# Patient Record
Sex: Female | Born: 1990 | Race: Black or African American | Hispanic: No | Marital: Single | Smoking: Never smoker
Health system: Southern US, Community
[De-identification: ages and names within clinical notes are randomized; demographics above are authoritative.]

---

## 2011-06-12 ENCOUNTER — Emergency Department (HOSPITAL_BASED_OUTPATIENT_CLINIC_OR_DEPARTMENT_OTHER)
Admission: EM | Admit: 2011-06-12 | Discharge: 2011-06-12 | Disposition: A | Discharge status: Home / Self Care | Payer: Self-pay | Attending: Emergency Medicine | Admitting: Emergency Medicine

## 2011-06-12 ENCOUNTER — Encounter: Payer: Self-pay | Admitting: Emergency Medicine

## 2011-06-12 DIAGNOSIS — N390 Urinary tract infection, site not specified: Secondary | ICD-10-CM | POA: Insufficient documentation

## 2011-06-12 DIAGNOSIS — B9689 Other specified bacterial agents as the cause of diseases classified elsewhere: Secondary | ICD-10-CM | POA: Insufficient documentation

## 2011-06-12 DIAGNOSIS — N760 Acute vaginitis: Secondary | ICD-10-CM | POA: Insufficient documentation

## 2011-06-12 DIAGNOSIS — A499 Bacterial infection, unspecified: Secondary | ICD-10-CM | POA: Insufficient documentation

## 2011-06-12 LAB — URINALYSIS, ROUTINE W REFLEX MICROSCOPIC
Bilirubin Urine: NEGATIVE
Nitrite: POSITIVE — AB
Specific Gravity, Urine: 1.02 (ref 1.005–1.030)
pH: 7 (ref 5.0–8.0)

## 2011-06-12 LAB — PREGNANCY, URINE: Preg Test, Ur: NEGATIVE

## 2011-06-12 LAB — WET PREP, GENITAL

## 2011-06-12 LAB — URINE MICROSCOPIC-ADD ON

## 2011-06-12 MED ORDER — METRONIDAZOLE 500 MG PO TABS: 500.0000 mg | ORAL_TABLET | Freq: Two times a day (BID) | ORAL | Status: AC

## 2011-06-12 MED ORDER — CIPROFLOXACIN HCL 500 MG PO TABS: 500.0000 mg | ORAL_TABLET | Freq: Two times a day (BID) | ORAL | Status: AC

## 2011-06-12 NOTE — ED Notes (Signed)
The patient is undressed from the waist down. The bed is lock and is in the lowest position, the call light is within reach. The patient is resting and warm blanket has been given.

## 2011-06-12 NOTE — ED Notes (Signed)
Vaginal itching and discharge x 2 weeks.  No dysuria.  No N/V/D.  No abdominal pain.

## 2011-06-12 NOTE — ED Provider Notes (Signed)
History     CSN: [id number] Arrival date & time: 06/12/2011  6:45 PM  Chief Complaint  Patient presents with  . Vaginal Itching  . Vaginal Discharge   HPI Comments: Pt states that she is having a white vaginal discharge  Patient is a 20 y.o. female presenting with vaginal discharge. The history is provided by the patient. No language interpreter was used.  Vaginal Discharge This is a new problem. The current episode started 1 to 4 weeks ago. The problem occurs constantly. The problem has been unchanged. Pertinent negatives include no abdominal pain, fever, numbness, sore throat or urinary symptoms. The symptoms are aggravated by nothing. She has tried nothing for the symptoms. The treatment provided no relief.    History reviewed. No pertinent past medical history.  History reviewed. No pertinent past surgical history.  History reviewed. No pertinent family history.  History  Substance Use Topics  . Smoking status: Never Smoker   . Smokeless tobacco: Not on file  . Alcohol Use: Yes     occasionally    OB History    Grav Para Term Preterm Abortions TAB SAB Ect Mult Living                  Review of Systems  Constitutional: Negative for fever.  HENT: Negative for sore throat.   Gastrointestinal: Negative for abdominal pain.  Genitourinary: Positive for vaginal discharge.  Neurological: Negative for numbness.  All other systems reviewed and are negative.    Physical Exam  BP 129/79  Pulse 73  Temp(Src) 98.4 F (36.9 C) (Oral)  Resp 18  SpO2 100%  LMP 05/29/2011  Physical Exam  Nursing note and vitals reviewed. Constitutional: She is oriented to person, place, and time. She appears well-developed and well-nourished.  HENT:  Head: Normocephalic and atraumatic.  Eyes: Pupils are equal, round, and reactive to light.  Cardiovascular: Normal rate and regular rhythm.   Pulmonary/Chest: Effort normal and breath sounds normal.  Abdominal: Soft. Bowel sounds are  normal.  Genitourinary: Cervix exhibits no motion tenderness. Right adnexum displays no tenderness. Left adnexum displays no tenderness. Vaginal discharge found.  Musculoskeletal: Normal range of motion.  Neurological: She is alert and oriented to person, place, and time.  Skin: Skin is dry.  Psychiatric: She has a normal mood and affect.    ED Course  Procedures Results for orders placed during the hospital encounter of 06/12/11  WET PREP, GENITAL      Component Value Range   Yeast, Wet Prep FEW (*) NONE SEEN    Trich, Wet Prep NONE SEEN  NONE SEEN    Clue Cells, Wet Prep TOO NUMEROUS TO COUNT (*) NONE SEEN    WBC, Wet Prep HPF POC FEW (*) NONE SEEN   URINALYSIS, ROUTINE W REFLEX MICROSCOPIC      Component Value Range   Color, Urine YELLOW  YELLOW    Appearance CLOUDY (*) CLEAR    Specific Gravity, Urine 1.020  1.005 - 1.030    pH 7.0  5.0 - 8.0    Glucose, UA NEGATIVE  NEGATIVE (mg/dL)   Hgb urine dipstick NEGATIVE  NEGATIVE    Bilirubin Urine NEGATIVE  NEGATIVE    Ketones, ur NEGATIVE  NEGATIVE (mg/dL)   Protein, ur NEGATIVE  NEGATIVE (mg/dL)   Urobilinogen, UA 0.2  0.0 - 1.0 (mg/dL)   Nitrite POSITIVE (*) NEGATIVE    Leukocytes, UA MODERATE (*) NEGATIVE   PREGNANCY, URINE      Component Value Range  Preg Test, Ur NEGATIVE    URINE MICROSCOPIC-ADD ON      Component Value Range   Squamous Epithelial / LPF FEW (*) RARE    WBC, UA 7-10  <3 (WBC/hpf)   Bacteria, UA MANY (*) RARE    No results found.  MDM Will treat for a simple uti and bv:pt is not having fever:pt is not pregnant     Teressa Lower, NP 06/12/11 1958

## 2011-06-13 NOTE — ED Provider Notes (Signed)
Medical screening examination/treatment/procedure(s) were performed by non-physician practitioner and as supervising physician I was immediately available for consultation/collaboration.   Vida Roller, MD 06/13/11 [phone]

## 2011-06-14 LAB — GC/CHLAMYDIA PROBE AMP, GENITAL: Chlamydia, DNA Probe: NEGATIVE

## 2012-11-18 ENCOUNTER — Emergency Department: Payer: Self-pay | Admitting: Emergency Medicine

## 2012-11-18 LAB — URINALYSIS, COMPLETE
Bilirubin,UR: NEGATIVE
Blood: NEGATIVE
Glucose,UR: NEGATIVE mg/dL (ref 0–75)
Leukocyte Esterase: NEGATIVE
Nitrite: POSITIVE
Ph: 5 (ref 4.5–8.0)
Protein: NEGATIVE
RBC,UR: 1 /HPF (ref 0–5)
Specific Gravity: 1.024 (ref 1.003–1.030)
Squamous Epithelial: 3
WBC UR: 2 /HPF (ref 0–5)

## 2012-11-18 LAB — CBC
HCT: 39.7 % (ref 35.0–47.0)
HGB: 13.3 g/dL (ref 12.0–16.0)
MCH: 28.9 pg (ref 26.0–34.0)
MCHC: 33.5 g/dL (ref 32.0–36.0)
Platelet: 291 10*3/uL (ref 150–440)
WBC: 12.7 10*3/uL — ABNORMAL HIGH (ref 3.6–11.0)

## 2012-11-18 LAB — COMPREHENSIVE METABOLIC PANEL
Albumin: 4.7 g/dL (ref 3.4–5.0)
Alkaline Phosphatase: 92 U/L (ref 50–136)
Anion Gap: 9 (ref 7–16)
EGFR (Non-African Amer.): >60
Glucose: 111 mg/dL — ABNORMAL HIGH (ref 65–99)
Osmolality: 274 (ref 275–301)
Potassium: 3.7 mmol/L (ref 3.5–5.1)
Sodium: 137 mmol/L (ref 136–145)

## 2012-11-18 LAB — LIPASE, BLOOD: Lipase: 126 U/L (ref 73–393)

## 2013-07-24 ENCOUNTER — Emergency Department: Payer: Self-pay | Admitting: Internal Medicine

## 2013-12-13 ENCOUNTER — Emergency Department: Payer: Self-pay | Admitting: Internal Medicine

## 2013-12-13 LAB — COMPREHENSIVE METABOLIC PANEL
ALBUMIN: 4.5 g/dL (ref 3.4–5.0)
Alkaline Phosphatase: 76 U/L
Anion Gap: 8 (ref 7–16)
BUN: 9 mg/dL (ref 7–18)
Bilirubin,Total: 1.1 mg/dL — ABNORMAL HIGH (ref 0.2–1.0)
CALCIUM: 10 mg/dL (ref 8.5–10.1)
CHLORIDE: 101 mmol/L (ref 98–107)
CREATININE: 0.73 mg/dL (ref 0.60–1.30)
Co2: 28 mmol/L (ref 21–32)
GLUCOSE: 116 mg/dL — AB (ref 65–99)
OSMOLALITY: 273 (ref 275–301)
Potassium: 3.6 mmol/L (ref 3.5–5.1)
SGOT(AST): 18 U/L (ref 15–37)
SGPT (ALT): 19 U/L (ref 12–78)
Sodium: 137 mmol/L (ref 136–145)
Total Protein: 8.9 g/dL — ABNORMAL HIGH (ref 6.4–8.2)

## 2013-12-13 LAB — CBC WITH DIFFERENTIAL/PLATELET
Basophil #: 0.1 10*3/uL (ref 0.0–0.1)
Basophil %: 0.4 %
EOS PCT: 0.3 %
Eosinophil #: 0 10*3/uL (ref 0.0–0.7)
HCT: 40.1 % (ref 35.0–47.0)
HGB: 13 g/dL (ref 12.0–16.0)
LYMPHS ABS: 1.4 10*3/uL (ref 1.0–3.6)
LYMPHS PCT: 9.8 %
MCH: 29 pg (ref 26.0–34.0)
MCHC: 32.4 g/dL (ref 32.0–36.0)
MCV: 90 fL (ref 80–100)
MONOS PCT: 6.2 %
Monocyte #: 0.9 x10 3/mm (ref 0.2–0.9)
NEUTROS ABS: 11.8 10*3/uL — AB (ref 1.4–6.5)
Neutrophil %: 83.3 %
Platelet: 321 10*3/uL (ref 150–440)
RBC: 4.48 10*6/uL (ref 3.80–5.20)
RDW: 13.6 % (ref 11.5–14.5)
WBC: 14.2 10*3/uL — ABNORMAL HIGH (ref 3.6–11.0)

## 2013-12-13 LAB — URINALYSIS, COMPLETE
Bacteria: NONE SEEN
Bilirubin,UR: NEGATIVE
GLUCOSE, UR: NEGATIVE mg/dL (ref 0–75)
Ketone: NEGATIVE
Nitrite: NEGATIVE
PH: 5 (ref 4.5–8.0)
Protein: NEGATIVE
RBC,UR: 1 /HPF (ref 0–5)
SPECIFIC GRAVITY: 1.011 (ref 1.003–1.030)

## 2013-12-13 LAB — GC/CHLAMYDIA PROBE AMP

## 2013-12-13 LAB — WET PREP, GENITAL

## 2017-11-01 ENCOUNTER — Other Ambulatory Visit: Payer: Self-pay

## 2017-11-01 ENCOUNTER — Emergency Department (HOSPITAL_COMMUNITY)
Admission: EM | Admit: 2017-11-01 | Discharge: 2017-11-01 | Disposition: A | Discharge status: Home / Self Care | Payer: Self-pay | Attending: Emergency Medicine | Admitting: Emergency Medicine

## 2017-11-01 ENCOUNTER — Emergency Department (HOSPITAL_COMMUNITY): Payer: Self-pay

## 2017-11-01 DIAGNOSIS — M26621 Arthralgia of right temporomandibular joint: Secondary | ICD-10-CM | POA: Insufficient documentation

## 2017-11-01 MED ORDER — ACETAMINOPHEN 500 MG PO TABS
500.0000 mg | ORAL_TABLET | Freq: Once | ORAL | Status: AC
  Administered 2017-11-01: 500 mg via ORAL
  Filled 2017-11-01: qty 1

## 2017-11-01 MED ORDER — METHOCARBAMOL 500 MG PO TABS
750.0000 mg | ORAL_TABLET | Freq: Once | ORAL | Status: AC
Start: 2017-11-01 — End: 2017-11-01
  Administered 2017-11-01: 750 mg via ORAL
  Filled 2017-11-01: qty 2

## 2017-11-01 MED ORDER — METHOCARBAMOL 500 MG PO TABS: 500.0000 mg | ORAL_TABLET | Freq: Two times a day (BID) | ORAL | 0 refills | Status: DC

## 2017-11-01 NOTE — ED Notes (Signed)
Patient transported to CT 
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
## 2017-11-01 NOTE — Discharge Instructions (Signed)
Your seen here today for right jaw pain.  Your CT did not show evidence of fracture or dislocation.  No prescribing you a muscle relaxer that I would like you to take for your symptoms.  Please take Tylenol 650 mg every 6 hours as needed additionally for pain.  Please follow-up with ENT specialist.
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
## 2019-08-03 ENCOUNTER — Encounter (HOSPITAL_COMMUNITY): Payer: Self-pay | Admitting: Emergency Medicine

## 2019-08-03 ENCOUNTER — Emergency Department (HOSPITAL_COMMUNITY)
Admission: EM | Admit: 2019-08-03 | Discharge: 2019-08-03 | Disposition: A | Discharge status: Home / Self Care | Payer: Self-pay | Attending: Emergency Medicine | Admitting: Emergency Medicine

## 2019-08-03 ENCOUNTER — Other Ambulatory Visit: Payer: Self-pay

## 2019-08-03 ENCOUNTER — Emergency Department (HOSPITAL_COMMUNITY): Payer: Self-pay

## 2019-08-03 DIAGNOSIS — Y939 Activity, unspecified: Secondary | ICD-10-CM | POA: Insufficient documentation

## 2019-08-03 DIAGNOSIS — Y929 Unspecified place or not applicable: Secondary | ICD-10-CM | POA: Insufficient documentation

## 2019-08-03 DIAGNOSIS — Y999 Unspecified external cause status: Secondary | ICD-10-CM | POA: Insufficient documentation

## 2019-08-03 DIAGNOSIS — R42 Dizziness and giddiness: Secondary | ICD-10-CM | POA: Insufficient documentation

## 2019-08-03 DIAGNOSIS — S0990XA Unspecified injury of head, initial encounter: Secondary | ICD-10-CM | POA: Insufficient documentation

## 2019-08-03 DIAGNOSIS — R519 Headache, unspecified: Secondary | ICD-10-CM | POA: Insufficient documentation

## 2019-08-03 DIAGNOSIS — S060X0A Concussion without loss of consciousness, initial encounter: Secondary | ICD-10-CM | POA: Insufficient documentation

## 2019-08-03 DIAGNOSIS — S060X9A Concussion with loss of consciousness of unspecified duration, initial encounter: Secondary | ICD-10-CM

## 2019-08-03 DIAGNOSIS — H53149 Visual discomfort, unspecified: Secondary | ICD-10-CM | POA: Insufficient documentation

## 2019-08-03 MED ORDER — HYDROCODONE-ACETAMINOPHEN 5-325 MG PO TABS: 1.0000 | ORAL_TABLET | Freq: Four times a day (QID) | ORAL | 0 refills | Status: AC | PRN

## 2019-08-03 MED ORDER — HYDROCODONE-ACETAMINOPHEN 5-325 MG PO TABS
1.0000 | ORAL_TABLET | Freq: Once | ORAL | Status: AC
  Administered 2019-08-03: 1 via ORAL
  Filled 2019-08-03: qty 1

## 2019-08-03 MED ORDER — NAPROXEN 500 MG PO TABS: 500.0000 mg | ORAL_TABLET | Freq: Two times a day (BID) | ORAL | 0 refills | Status: DC

## 2019-08-03 NOTE — Discharge Instructions (Signed)
Get plenty of rest.  Apply ice to the area of pain and swelling and try to sleep with your head elevated some to decrease the swelling. Thank you for allowing me to care for you today. Please return to the emergency department if you have new or worsening symptoms. Take your medications as instructed.

## 2019-08-03 NOTE — ED Notes (Signed)
Pt A&ox4, ambulatory at d/c with independent steady gait, NAD. Pt verbalized understanding of d/c instructions and follow up care. 
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
## 2019-08-03 NOTE — ED Notes (Signed)
The PA at bedside providing update.
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
## 2019-10-12 ENCOUNTER — Other Ambulatory Visit: Payer: Self-pay

## 2019-10-12 DIAGNOSIS — Z20822 Contact with and (suspected) exposure to covid-19: Secondary | ICD-10-CM

## 2019-10-13 LAB — NOVEL CORONAVIRUS, NAA: SARS-CoV-2, NAA: DETECTED — AB

## 2019-10-14 ENCOUNTER — Telehealth (HOSPITAL_COMMUNITY): Payer: Self-pay | Admitting: Critical Care Medicine

## 2019-10-14 NOTE — Telephone Encounter (Signed)
Patient was positive Covid infection on December 18 and she is asymptomatic.  She is not a candidate for monoclonal antibody.  She understands to stay in isolation till December 29

## 2019-10-14 NOTE — Telephone Encounter (Signed)
-----   Message from Carlisle Beers, RN sent at 10/14/2019  6:59 AM EST -----  ----- Message ----- From: Lavone Neri Lab Results In Sent: 10/13/2019  10:36 PM EST To: Mobile Screening Testing Result Pool

## 2020-05-06 ENCOUNTER — Telehealth: Payer: Self-pay | Admitting: Physician Assistant

## 2020-05-06 DIAGNOSIS — R3989 Other symptoms and signs involving the genitourinary system: Secondary | ICD-10-CM

## 2020-05-06 MED ORDER — ACYCLOVIR 400 MG PO TABS: 400.0000 mg | ORAL_TABLET | Freq: Three times a day (TID) | ORAL | 0 refills | Status: AC

## 2020-05-06 NOTE — Progress Notes (Signed)
E-Visit for Herpes Simplex  We are sorry that you are not feeling well.  Here is how we plan to help!  Based on what you have shared ith me, it looks like you may be having an outbreak/flare-up of genital herpes.    I have prescribed I have prescribed Acyclovir 400 mg Take on by mouth three times daily for 5 days.    You can take ibuprofen 600mg  every 6 hours as needed for headache or pain. You can also alternate between ibuprofen and tylenol.   PLEASE SEEK EVALUATION IF THE HEADACHE PERSISTS, WORSENS, OR YOU DEVELOP A FEVER, NECK STIFFNESS, OR OTHER CONCERNING SYMPTOMS.   If you have been prescribed long term medications to be taken on a regular basis, it is important to follow the recommendations and take them as ordered.    Outbreaks usually include blisters and open sores in the genital area. Outbreaks that happen after the first time are usually not as severe and do not last as long. Genital Herpes Simplex is a commonly sexually transmitted viral infection that is found worldwide. Most of these genital infections are caused by one or two herpes simplex viruses that is passed from person to person during vaginal, oral, or anal sex. Sometimes, people do not know they have herpes because they do not have any symptoms.  Please be aware that if you have genital herpes you can be contagious even when you are not having rash or flare-up and you may not have any symptoms, even when you are taking suppressive medicines.  Herpes cannot be cured. The disease usually causes most problems during the first few years. After that, the virus is still there, but it causes few to no symptoms. Even when the virus is active, people with herpes can take medicines to reduce and help prevent symptoms.  Herpes is an infection that can cause blisters and open sores on the genital area. Herpes is caused by a virus that is passed from person to person during vaginal, oral, or anal sex. Sometimes, people do not know they  have herpes because they do not have any symptoms. Herpes cannot be cured. The disease usually causes most problems during the first few years. After that, the virus is still there, but it causes few to no symptoms. Even when the virus is active, people with herpes can take medicines to reduce and help prevent symptoms.  If you have been prescribed medications to be taken on a regular basis, it is important to follow the recommendations and take them as ordered.  Some people with herpes never have any symptoms. But other people can develop symptoms within a few weeks of being infected with the herpes virus   Symptoms usually include blisters in the genital area. In women, this area includes the vagina, buttocks, anus, or thighs. In men, this area includes the penis, scrotum, anus, butt, or thighs. The blisters can become painful open sores, which then crust over as they heal. Sometimes, people can have other symptoms that include:  ?Blisters on the mouth or lips ?Fever, headache, or pain in the joints ?Trouble urinating  Outbreaks might occur every month or more often, or just once or twice a year. Sometimes, people can tell when an outbreak will occur, because they feel itching or pain beforehand. Sometimes they do not know that an outbreak is coming because they have no symptoms. Whatever your pattern is, keep in mind that herpes outbreaks usually become less frequent over time as you get  older. Certain things, called "triggers," can make outbreaks more likely to occur. These include stress, sunlight, menstrual periods,or getting sick.  Antiviral therapy can shorten the duration of symptoms and signs in primary infection, which, when untreated, can be associated with significant increase in the symptoms of the disease.  HOME CARE . Use a portable bath (such as a "Sitz bath") where you can sit in warm water for about 20 minutes. Your bathtub could also work. Avoid bubble baths.  . Keep the  genital area clean and dry and avoid tight clothes.  . Take over-the-counter pain medicine such as acetaminophen (brand name: Tylenol) or ibuprofen sample brand names: Advil, Motrin). But avoid aspirin.  . Only take medications as instructed by your medical team.  . You are most likely to spread herpes to a sex partner when you have blisters and open sores on your body. But it's also possible to spread herpes to your partner when you do not have any symptoms. That is because herpes can be present on your body without causing any symptoms, like blisters or pain.  . Telling your sex partner that you have herpes can be hard. But it can help protect them, since there are ways to lower the risk of spreading the infection.   . Using a condom every time you have sex  . Not having sex when you have symptoms  . Not having oral sex if you have blisters or open sores (in the genital area or around your mouth)  MAKE SURE YOU   . Understand these instructions. . Do not have sex without using a condom until you have been seen by a doctor and as instructed by the provider . If you are not better or improved within 7 days, you MUST have a follow up at your doctor or the health department for evaluation. There are other causes of rashes in the genital region.  Thank you for choosing an e-visit. Your e-visit answers were reviewed by a board certified advanced clinical practitioner to complete your personal care plan. Depending upon the condition, your plan could have included both over the counter or prescription medications.  Please review your pharmacy choice. Make sure the pharmacy is open so you can pick up prescription now. If there is a problem, you may contact your provider through [employer] and have the prescription routed to another pharmacy.  Your safety is important to [place]. If you have drug allergies check your prescription carefully.   For the next 24 hours you can use MyChart to ask  questions about today's visit, request a non-urgent call back, or ask for a work or school excuse. You will get an email in the next two days asking about your experience. I hope that your e-visit has been valuable and will speed your recovery  Greater than 5 minutes, yet less than 10 minutes of time have been spent researching, coordinating, and implementing care for this patient today.

## 2020-05-23 ENCOUNTER — Other Ambulatory Visit: Payer: Self-pay

## 2020-05-23 ENCOUNTER — Emergency Department (HOSPITAL_COMMUNITY)
Admission: EM | Admit: 2020-05-23 | Discharge: 2020-05-23 | Disposition: A | Discharge status: Home / Self Care | Payer: No Typology Code available for payment source | Attending: Emergency Medicine | Admitting: Emergency Medicine

## 2020-05-23 ENCOUNTER — Encounter (HOSPITAL_COMMUNITY): Payer: Self-pay

## 2020-05-23 ENCOUNTER — Emergency Department (HOSPITAL_COMMUNITY): Payer: No Typology Code available for payment source

## 2020-05-23 DIAGNOSIS — Y9389 Activity, other specified: Secondary | ICD-10-CM | POA: Insufficient documentation

## 2020-05-23 DIAGNOSIS — Y999 Unspecified external cause status: Secondary | ICD-10-CM | POA: Insufficient documentation

## 2020-05-23 DIAGNOSIS — R519 Headache, unspecified: Secondary | ICD-10-CM | POA: Diagnosis not present

## 2020-05-23 DIAGNOSIS — Y9289 Other specified places as the place of occurrence of the external cause: Secondary | ICD-10-CM | POA: Insufficient documentation

## 2020-05-23 DIAGNOSIS — S0081XA Abrasion of other part of head, initial encounter: Secondary | ICD-10-CM | POA: Diagnosis not present

## 2020-05-23 DIAGNOSIS — T07XXXA Unspecified multiple injuries, initial encounter: Secondary | ICD-10-CM

## 2020-05-23 MED ORDER — METHOCARBAMOL 750 MG PO TABS
750.0000 mg | ORAL_TABLET | Freq: Every evening | ORAL | 0 refills | Status: AC | PRN
Start: 2020-05-23 — End: 2020-05-28

## 2020-05-23 MED ORDER — OXYCODONE-ACETAMINOPHEN 5-325 MG PO TABS
1.0000 | ORAL_TABLET | Freq: Once | ORAL | Status: AC
  Administered 2020-05-23: 1 via ORAL
  Filled 2020-05-23: qty 1

## 2020-05-23 MED ORDER — NAPROXEN 500 MG PO TABS
500.0000 mg | ORAL_TABLET | Freq: Two times a day (BID) | ORAL | 0 refills | Status: AC
Start: 2020-05-23 — End: 2020-05-30

## 2020-05-23 MED ORDER — ONDANSETRON 4 MG PO TBDP
4.0000 mg | ORAL_TABLET | Freq: Once | ORAL | Status: AC
  Administered 2020-05-23: 4 mg via ORAL
  Filled 2020-05-23: qty 1
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
## 2020-05-23 NOTE — Discharge Instructions (Signed)

## 2021-10-25 ENCOUNTER — Emergency Department (HOSPITAL_COMMUNITY)
Admission: EM | Admit: 2021-10-25 | Discharge: 2021-10-25 | Disposition: A | Discharge status: Home / Self Care | Payer: Self-pay | Attending: Emergency Medicine | Admitting: Emergency Medicine

## 2021-10-25 ENCOUNTER — Other Ambulatory Visit: Payer: Self-pay

## 2021-10-25 ENCOUNTER — Encounter (HOSPITAL_COMMUNITY): Payer: Self-pay | Admitting: Emergency Medicine

## 2021-10-25 DIAGNOSIS — K2920 Alcoholic gastritis without bleeding: Secondary | ICD-10-CM | POA: Insufficient documentation

## 2021-10-25 DIAGNOSIS — Y902 Blood alcohol level of 40-59 mg/100 ml: Secondary | ICD-10-CM | POA: Insufficient documentation

## 2021-10-25 DIAGNOSIS — F10120 Alcohol abuse with intoxication, uncomplicated: Secondary | ICD-10-CM | POA: Insufficient documentation

## 2021-10-25 LAB — CBC WITH DIFFERENTIAL/PLATELET
Abs Immature Granulocytes: 0.04 10*3/uL (ref 0.00–0.07)
Basophils Absolute: 0.1 10*3/uL (ref 0.0–0.1)
Basophils Relative: 1 %
Eosinophils Absolute: 0.1 10*3/uL (ref 0.0–0.5)
Eosinophils Relative: 1 %
HCT: 36.9 % (ref 36.0–46.0)
Hemoglobin: 12 g/dL (ref 12.0–15.0)
Immature Granulocytes: 0 %
Lymphocytes Relative: 19 %
Lymphs Abs: 1.7 10*3/uL (ref 0.7–4.0)
MCH: 29.3 pg (ref 26.0–34.0)
MCHC: 32.5 g/dL (ref 30.0–36.0)
MCV: 90 fL (ref 80.0–100.0)
Monocytes Absolute: 0.6 10*3/uL (ref 0.1–1.0)
Monocytes Relative: 6 %
Neutro Abs: 6.8 10*3/uL (ref 1.7–7.7)
Neutrophils Relative %: 73 %
Platelets: 356 10*3/uL (ref 150–400)
RBC: 4.1 MIL/uL (ref 3.87–5.11)
RDW: 14.2 % (ref 11.5–15.5)
WBC: 9.3 10*3/uL (ref 4.0–10.5)
nRBC: 0 % (ref 0.0–0.2)

## 2021-10-25 LAB — COMPREHENSIVE METABOLIC PANEL
ALT: 18 U/L (ref 0–44)
AST: 27 U/L (ref 15–41)
Albumin: 4.7 g/dL (ref 3.5–5.0)
Alkaline Phosphatase: 66 U/L (ref 38–126)
Anion gap: 11 (ref 5–15)
BUN: 10 mg/dL (ref 6–20)
CO2: 20 mmol/L — ABNORMAL LOW (ref 22–32)
Calcium: 9.3 mg/dL (ref 8.9–10.3)
Chloride: 109 mmol/L (ref 98–111)
Creatinine, Ser: 0.61 mg/dL (ref 0.44–1.00)
GFR, Estimated: >60 mL/min (ref >60)
Glucose, Bld: 112 mg/dL — ABNORMAL HIGH (ref 70–99)
Potassium: 3.5 mmol/L (ref 3.5–5.1)
Sodium: 140 mmol/L (ref 135–145)
Total Bilirubin: 0.4 mg/dL (ref 0.3–1.2)
Total Protein: 8 g/dL (ref 6.5–8.1)

## 2021-10-25 LAB — LIPASE, BLOOD: Lipase: 28 U/L (ref 11–51)

## 2021-10-25 LAB — ETHANOL: Alcohol, Ethyl (B): 40 mg/dL — ABNORMAL HIGH (ref <10)

## 2021-10-25 MED ORDER — SODIUM CHLORIDE 0.9 % IV BOLUS
1000.0000 mL | Freq: Once | INTRAVENOUS | Status: AC
  Administered 2021-10-25: 1000 mL via INTRAVENOUS

## 2021-10-25 MED ORDER — PANTOPRAZOLE SODIUM 40 MG IV SOLR
40.0000 mg | Freq: Once | INTRAVENOUS | Status: AC
  Administered 2021-10-25: 40 mg via INTRAVENOUS
  Filled 2021-10-25: qty 40

## 2021-10-25 MED ORDER — ONDANSETRON 8 MG PO TBDP: 8.0000 mg | ORAL_TABLET | Freq: Three times a day (TID) | ORAL | 0 refills | Status: DC | PRN

## 2021-10-25 MED ORDER — ONDANSETRON 4 MG PO TBDP
8.0000 mg | ORAL_TABLET | Freq: Once | ORAL | Status: AC
  Administered 2021-10-25: 8 mg via ORAL
  Filled 2021-10-25: qty 2

## 2021-10-25 NOTE — ED Triage Notes (Signed)
C/o nausea and vomiting since 10am. Pt thinks she has alcohol poisoning but states she only drank 2 double shots last night which is less than she usually drinks.  States only having abd pain when vomiting.

## 2021-10-25 NOTE — Discharge Instructions (Addendum)
Take the medications as needed to help with nausea and vomiting.  Drink small amounts of fluids and slowly advance her diet as tolerated.

## 2021-10-25 NOTE — ED Notes (Signed)
Pt verbalized understanding of d/c instructions, meds, and followup care. Denies questions. VSS, no distress noted. N/V resolved and free of pain. Steady gait to exit with all belongings.

## 2021-10-25 NOTE — ED Provider Notes (Signed)
[facility] EMERGENCY DEPARTMENT Provider Note   CSN: [id number] Arrival date & time: 10/25/21  1658     History  Chief complaint: Nausea vomiting dehydration  Ana Miller is a 31 y.o. female.  HPI  Patient presents to the emergency room with complaints of persistent nausea and vomiting.  Patient informed triage that she drank less than usual although she told me she drank more than usual.  Patient woke up this morning and started having significant nausea and vomiting.  She has not been able to keep anything down.  She has felt chilled but has not had any fevers.  She is not having any pain right now but does hurt when she vomits.  She denies any cough or sore throat.  No urinary symptoms  Home Medications Prior to Admission medications   Medication Sig Start Date End Date Taking? Authorizing Provider  ondansetron (ZOFRAN-ODT) 8 MG disintegrating tablet Take 1 tablet (8 mg total) by mouth every 8 (eight) hours as needed for nausea or vomiting. 10/25/21  Yes Linwood Dibbles, MD      Allergies    Amoxicillin    Review of Systems   Review of Systems  Constitutional:  Negative for fever.  Respiratory:  Negative for shortness of breath.   Cardiovascular:  Negative for chest pain.  Genitourinary:  Negative for dysuria.  All other systems reviewed and are negative.  Physical Exam Updated Vital Signs BP (!) 134/97 (BP Location: Right Arm)    Pulse 78    Temp 98.7 F (37.1 C) (Oral)    Resp 20    LMP 10/22/2021    SpO2 100%  Physical Exam Vitals and nursing note reviewed.  Constitutional:      Appearance: She is well-developed. She is ill-appearing.  HENT:     Head: Normocephalic and atraumatic.     Right Ear: External ear normal.     Left Ear: External ear normal.  Eyes:     General: No scleral icterus.       Right eye: No discharge.        Left eye: No discharge.     Conjunctiva/sclera: Conjunctivae normal.  Neck:     Trachea: No tracheal deviation.   Cardiovascular:     Rate and Rhythm: Normal rate and regular rhythm.  Pulmonary:     Effort: Pulmonary effort is normal. No respiratory distress.     Breath sounds: Normal breath sounds. No stridor. No wheezing or rales.  Abdominal:     General: Bowel sounds are normal. There is no distension.     Palpations: Abdomen is soft.     Tenderness: There is no abdominal tenderness. There is no guarding or rebound.  Musculoskeletal:        General: No tenderness or deformity.     Cervical back: Neck supple.  Skin:    General: Skin is warm and dry.     Coloration: Skin is pale.     Findings: No rash.  Neurological:     General: No focal deficit present.     Mental Status: She is alert.     Cranial Nerves: No cranial nerve deficit (no facial droop, extraocular movements intact, no slurred speech).     Sensory: No sensory deficit.     Motor: No abnormal muscle tone or seizure activity.     Coordination: Coordination normal.  Psychiatric:        Mood and Affect: Mood normal.    ED Results / Procedures /  Treatments   Labs (all labs ordered are listed, but only abnormal results are displayed) Labs Reviewed  COMPREHENSIVE METABOLIC PANEL - Abnormal; Notable for the following components:      Result Value   CO2 20 (*)    Glucose, Bld 112 (*)    All other components within normal limits  ETHANOL - Abnormal; Notable for the following components:   Alcohol, Ethyl (B) 40 (*)    All other components within normal limits  LIPASE, BLOOD  CBC WITH DIFFERENTIAL/PLATELET  CBC WITH DIFFERENTIAL/PLATELET  RAPID URINE DRUG SCREEN, HOSP PERFORMED  I-STAT BETA HCG BLOOD, ED ([facility], [facility], [facility] ONLY)    EKG None  Radiology No results found.  Procedures Procedures    Medications Ordered in ED Medications  ondansetron (ZOFRAN-ODT) disintegrating tablet 8 mg (8 mg Oral Given 10/25/21 1713)  sodium chloride 0.9 % bolus 1,000 mL (0 mLs Intravenous Stopped 10/25/21 1826)  pantoprazole (PROTONIX)  injection 40 mg (40 mg Intravenous Given 10/25/21 1826)    ED Course/ Medical Decision Making/ A&P Clinical Course as of 10/25/21 1853  Sun Oct 25, 2021  1800 CBC normal [JK]  1836 Lipase and LFTs are normal.  Alcohol level still elevated [JK]    Clinical Course User Index [JK] Linwood Dibbles, MD                           Medical Decision Making  Patient presented with complaints of nausea vomiting associated with recent alcohol consumption.  No signs of hepatitis or pancreatitis.  Findings not suggestive of bowel obstruction.  Patient did require IV hydration and IV antiemetics.  No signs of any acute complications during treatment.  Patient symptoms improved and she did not have any further vomiting.  She was able to drink some ginger ale here in the ED.  At this time no indications to require hospitalization.  Appears appropriate for outpatient management.        Final Clinical Impression(s) / ED Diagnoses Final diagnoses:  Acute alcoholic gastritis without hemorrhage  Hangover without complication (HCC)    Rx / DC Orders ED Discharge Orders          Ordered    ondansetron (ZOFRAN-ODT) 8 MG disintegrating tablet  Every 8 hours PRN        10/25/21 1852              Linwood Dibbles, MD 10/25/21 1853

## 2021-10-25 NOTE — ED Provider Notes (Signed)
Emergency Medicine Provider Triage Evaluation Note  Ana Miller , a 31 y.o. female  was evaluated in triage.  Pt complains of nausea and vomiting onset 10 AM today.  Reports abdominal pain which she believes is from the vomiting.  Denies changes in bowel or bladder habits or sick contacts, fevers..  States she drank 2 double shots last night which is significantly less than she typically drinks and does not understand why she is vomiting today.  Does smoke marijuana.  Review of Systems  Positive: Nausea, vomiting, Donnell pain Negative: Fevers, chills, changes in bowel or bladder habits  Physical Exam  BP (!) 134/97 (BP Location: Right Arm)    Pulse 78    Temp 98.7 F (37.1 C) (Oral)    Resp 20    SpO2 100%  Gen:   Awake, no distress   Resp:  Normal effort  MSK:   Moves extremities without difficulty  Other:  Abd soft and non tender, HR RRR  Medical Decision Making  Medically screening exam initiated at 5:04 PM.  Appropriate orders placed.  DAYLYNN BERGSTRAND was informed that the remainder of the evaluation will be completed by another provider, this initial triage assessment does not replace that evaluation, and the importance of remaining in the ED until their evaluation is complete.     Tacy Learn, PA-C 10/25/21 1705    Dorie Rank, MD 10/25/21 2114

## 2022-04-11 ENCOUNTER — Telehealth: Payer: Medicaid Other | Admitting: Emergency Medicine

## 2022-04-11 ENCOUNTER — Encounter: Payer: Self-pay | Admitting: Emergency Medicine

## 2022-04-11 DIAGNOSIS — A6004 Herpesviral vulvovaginitis: Secondary | ICD-10-CM

## 2022-04-11 MED ORDER — VALACYCLOVIR HCL 500 MG PO TABS: 500.0000 mg | ORAL_TABLET | Freq: Two times a day (BID) | ORAL | 0 refills | Status: AC

## 2022-04-11 NOTE — Progress Notes (Signed)
I have spent 5 minutes in review of e-visit questionnaire, review and updating patient chart, medical decision making and response to patient.   Amoreena Neubert, PA-C    

## 2022-04-11 NOTE — Progress Notes (Signed)
E-Visit for Herpes Simplex  We are sorry that you are not feeling well.  Here is how we plan to help!  Based on what you have shared ith me, it looks like you may be having an outbreak/flare-up of genital herpes.    I have prescribed I have prescribed Valacyclovir 500 mg Take one by mouth twice a day for 3 days.    If you have been prescribed long term medications to be taken on a regular basis, it is important to follow the recommendations and take them as ordered.    Outbreaks usually include blisters and open sores in the genital area. Outbreaks that happen after the first time are usually not as severe and do not last as long. Genital Herpes Simplex is a commonly sexually transmitted viral infection that is found worldwide. Most of these genital infections are caused by one or two herpes simplex viruses that is passed from person to person during vaginal, oral, or anal sex. Sometimes, people do not know they have herpes because they do not have any symptoms.  Please be aware that if you have genital herpes you can be contagious even when you are not having rash or flare-up and you may not have any symptoms, even when you are taking suppressive medicines.  Herpes cannot be cured. The disease usually causes most problems during the first few years. After that, the virus is still there, but it causes few to no symptoms. Even when the virus is active, people with herpes can take medicines to reduce and help prevent symptoms.  Herpes is an infection that can cause blisters and open sores on the genital area. Herpes is caused by a virus that is passed from person to person during vaginal, oral, or anal sex. Sometimes, people do not know they have herpes because they do not have any symptoms. Herpes cannot be cured. The disease usually causes most problems during the first few years. After that, the virus is still there, but it causes few to no symptoms. Even when the virus is active, people with herpes  can take medicines to reduce and help prevent symptoms.  If you have been prescribed medications to be taken on a regular basis, it is important to follow the recommendations and take them as ordered.  Some people with herpes never have any symptoms. But other people can develop symptoms within a few weeks of being infected with the herpes virus   Symptoms usually include blisters in the genital area. In women, this area includes the vagina, buttocks, anus, or thighs. In men, this area includes the penis, scrotum, anus, butt, or thighs. The blisters can become painful open sores, which then crust over as they heal. Sometimes, people can have other symptoms that include:  ?Blisters on the mouth or lips ?Fever, headache, or pain in the joints ?Trouble urinating  Outbreaks might occur every month or more often, or just once or twice a year. Sometimes, people can tell when an outbreak will occur, because they feel itching or pain beforehand. Sometimes they do not know that an outbreak is coming because they have no symptoms. Whatever your pattern is, keep in mind that herpes outbreaks usually become less frequent over time as you get older. Certain things, called "triggers," can make outbreaks more likely to occur. These include stress, sunlight, menstrual periods,or getting sick.  Antiviral therapy can shorten the duration of symptoms and signs in primary infection, which, when untreated, can be associated with significant increase in the   symptoms of the disease.  HOME CARE Use a portable bath (such as a "Sitz bath") where you can sit in warm water for about 20 minutes. Your bathtub could also work. Avoid bubble baths.  Keep the genital area clean and dry and avoid tight clothes.  Take over-the-counter pain medicine such as acetaminophen (brand name: Tylenol) or ibuprofen sample brand names: Advil, Motrin). But avoid aspirin.  Only take medications as instructed by your medical team.  You are  most likely to spread herpes to a sex partner when you have blisters and open sores on your body. But it's also possible to spread herpes to your partner when you do not have any symptoms. That is because herpes can be present on your body without causing any symptoms, like blisters or pain.  Telling your sex partner that you have herpes can be hard. But it can help protect them, since there are ways to lower the risk of spreading the infection.   Using a condom every time you have sex  Not having sex when you have symptoms  Not having oral sex if you have blisters or open sores (in the genital area or around your mouth)  MAKE SURE YOU   Understand these instructions. Do not have sex without using a condom until you have been seen by a doctor and as instructed by the provider If you are not better or improved within 7 days, you MUST have a follow up at your doctor or the health department for evaluation. There are other causes of rashes in the genital region.  Thank you for choosing an e-visit.  Your e-visit answers were reviewed by a board certified advanced clinical practitioner to complete your personal care plan. Depending upon the condition, your plan could have included both over the counter or prescription medications.  Please review your pharmacy choice. Make sure the pharmacy is open so you can pick up prescription now. If there is a problem, you may contact your provider through MyChart messaging and have the prescription routed to another pharmacy.  Your safety is important to us. If you have drug allergies check your prescription carefully.   For the next 24 hours you can use MyChart to ask questions about today's visit, request a non-urgent call back, or ask for a work or school excuse. You will get an email in the next two days asking about your experience. I hope that your e-visit has been valuable and will speed your recovery.      

## 2022-05-10 ENCOUNTER — Telehealth: Payer: Self-pay | Admitting: Physician Assistant

## 2022-05-10 DIAGNOSIS — A6004 Herpesviral vulvovaginitis: Secondary | ICD-10-CM

## 2022-05-10 MED ORDER — VALACYCLOVIR HCL 500 MG PO TABS: 500.0000 mg | ORAL_TABLET | Freq: Two times a day (BID) | ORAL | 0 refills | Status: AC

## 2022-05-10 NOTE — Progress Notes (Signed)
E-Visit for Herpes Simplex  We are sorry that you are not feeling well.  Here is how we plan to help!  Based on what you have shared ith me, it looks like you may be having an outbreak/flare-up of genital herpes.    I have prescribed I have prescribed Valacyclovir 500 mg Take one by mouth twice a day for 3 days.    If you have been prescribed long term medications to be taken on a regular basis, it is important to follow the recommendations and take them as ordered.    Outbreaks usually include blisters and open sores in the genital area. Outbreaks that happen after the first time are usually not as severe and do not last as long. Genital Herpes Simplex is a commonly sexually transmitted viral infection that is found worldwide. Most of these genital infections are caused by one or two herpes simplex viruses that is passed from person to person during vaginal, oral, or anal sex. Sometimes, people do not know they have herpes because they do not have any symptoms.  Please be aware that if you have genital herpes you can be contagious even when you are not having rash or flare-up and you may not have any symptoms, even when you are taking suppressive medicines.  Herpes cannot be cured. The disease usually causes most problems during the first few years. After that, the virus is still there, but it causes few to no symptoms. Even when the virus is active, people with herpes can take medicines to reduce and help prevent symptoms.  Herpes is an infection that can cause blisters and open sores on the genital area. Herpes is caused by a virus that is passed from person to person during vaginal, oral, or anal sex. Sometimes, people do not know they have herpes because they do not have any symptoms. Herpes cannot be cured. The disease usually causes most problems during the first few years. After that, the virus is still there, but it causes few to no symptoms. Even when the virus is active, people with herpes  can take medicines to reduce and help prevent symptoms.  If you have been prescribed medications to be taken on a regular basis, it is important to follow the recommendations and take them as ordered.  Some people with herpes never have any symptoms. But other people can develop symptoms within a few weeks of being infected with the herpes virus   Symptoms usually include blisters in the genital area. In women, this area includes the vagina, buttocks, anus, or thighs. In men, this area includes the penis, scrotum, anus, butt, or thighs. The blisters can become painful open sores, which then crust over as they heal. Sometimes, people can have other symptoms that include:  ?Blisters on the mouth or lips ?Fever, headache, or pain in the joints ?Trouble urinating  Outbreaks might occur every month or more often, or just once or twice a year. Sometimes, people can tell when an outbreak will occur, because they feel itching or pain beforehand. Sometimes they do not know that an outbreak is coming because they have no symptoms. Whatever your pattern is, keep in mind that herpes outbreaks usually become less frequent over time as you get older. Certain things, called "triggers," can make outbreaks more likely to occur. These include stress, sunlight, menstrual periods,or getting sick.  Antiviral therapy can shorten the duration of symptoms and signs in primary infection, which, when untreated, can be associated with significant increase in the   symptoms of the disease.  HOME CARE Use a portable bath (such as a "Sitz bath") where you can sit in warm water for about 20 minutes. Your bathtub could also work. Avoid bubble baths.  Keep the genital area clean and dry and avoid tight clothes.  Take over-the-counter pain medicine such as acetaminophen (brand name: Tylenol) or ibuprofen sample brand names: Advil, Motrin). But avoid aspirin.  Only take medications as instructed by your medical team.  You are  most likely to spread herpes to a sex partner when you have blisters and open sores on your body. But it's also possible to spread herpes to your partner when you do not have any symptoms. That is because herpes can be present on your body without causing any symptoms, like blisters or pain.  Telling your sex partner that you have herpes can be hard. But it can help protect them, since there are ways to lower the risk of spreading the infection.   Using a condom every time you have sex  Not having sex when you have symptoms  Not having oral sex if you have blisters or open sores (in the genital area or around your mouth)  MAKE SURE YOU   Understand these instructions. Do not have sex without using a condom until you have been seen by a doctor and as instructed by the provider If you are not better or improved within 7 days, you MUST have a follow up at your doctor or the health department for evaluation. There are other causes of rashes in the genital region.  Thank you for choosing an e-visit.  Your e-visit answers were reviewed by a board certified advanced clinical practitioner to complete your personal care plan. Depending upon the condition, your plan could have included both over the counter or prescription medications.  Please review your pharmacy choice. Make sure the pharmacy is open so you can pick up prescription now. If there is a problem, you may contact your provider through MyChart messaging and have the prescription routed to another pharmacy.  Your safety is important to us. If you have drug allergies check your prescription carefully.   For the next 24 hours you can use MyChart to ask questions about today's visit, request a non-urgent call back, or ask for a work or school excuse. You will get an email in the next two days asking about your experience. I hope that your e-visit has been valuable and will speed your recovery.      

## 2022-05-10 NOTE — Progress Notes (Signed)
I have spent 5 minutes in review of e-visit questionnaire, review and updating patient chart, medical decision making and response to patient.   Tylan Kinn Cody Annica Marinello, PA-C    
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
## 2022-09-24 DIAGNOSIS — Z419 Encounter for procedure for purposes other than remedying health state, unspecified: Secondary | ICD-10-CM | POA: Diagnosis not present

## 2022-10-25 DIAGNOSIS — Z419 Encounter for procedure for purposes other than remedying health state, unspecified: Secondary | ICD-10-CM | POA: Diagnosis not present

## 2022-11-25 DIAGNOSIS — Z419 Encounter for procedure for purposes other than remedying health state, unspecified: Secondary | ICD-10-CM | POA: Diagnosis not present

## 2022-12-07 ENCOUNTER — Telehealth: Payer: Self-pay

## 2022-12-07 NOTE — Telephone Encounter (Signed)
Mychart msg sent. AS, CMA

## 2022-12-24 DIAGNOSIS — Z419 Encounter for procedure for purposes other than remedying health state, unspecified: Secondary | ICD-10-CM | POA: Diagnosis not present

## 2023-01-24 DIAGNOSIS — Z419 Encounter for procedure for purposes other than remedying health state, unspecified: Secondary | ICD-10-CM | POA: Diagnosis not present

## 2023-07-27 ENCOUNTER — Telehealth: Payer: Self-pay | Admitting: Physician Assistant

## 2023-07-27 DIAGNOSIS — A6004 Herpesviral vulvovaginitis: Secondary | ICD-10-CM

## 2023-07-27 DIAGNOSIS — A6000 Herpesviral infection of urogenital system, unspecified: Secondary | ICD-10-CM | POA: Insufficient documentation

## 2023-07-27 MED ORDER — VALACYCLOVIR HCL 500 MG PO TABS: 500.0000 mg | ORAL_TABLET | Freq: Two times a day (BID) | ORAL | 0 refills | Status: DC

## 2023-07-27 NOTE — Progress Notes (Signed)
I have spent 5 minutes in review of e-visit questionnaire, review and updating patient chart, medical decision making and response to patient.   Mia Milan Cody Jacklynn Dehaas, PA-C    

## 2023-07-27 NOTE — Progress Notes (Signed)
E-Visit for Herpes Simplex  We are sorry that you are not feeling well.  Here is how we plan to help!  Based on what you have shared ith me, it looks like you may be having an outbreak/flare-up of genital herpes.    I have prescribed I have prescribed Valacyclovir 500 mg Take one by mouth twice a day for 3 days.    If you have been prescribed long term medications to be taken on a regular basis, it is important to follow the recommendations and take them as ordered.    Outbreaks usually include blisters and open sores in the genital area. Outbreaks that happen after the first time are usually not as severe and do not last as long. Genital Herpes Simplex is a commonly sexually transmitted viral infection that is found worldwide. Most of these genital infections are caused by one or two herpes simplex viruses that is passed from person to person during vaginal, oral, or anal sex. Sometimes, people do not know they have herpes because they do not have any symptoms.  Please be aware that if you have genital herpes you can be contagious even when you are not having rash or flare-up and you may not have any symptoms, even when you are taking suppressive medicines.  Herpes cannot be cured. The disease usually causes most problems during the first few years. After that, the virus is still there, but it causes few to no symptoms. Even when the virus is active, people with herpes can take medicines to reduce and help prevent symptoms.  Herpes is an infection that can cause blisters and open sores on the genital area. Herpes is caused by a virus that is passed from person to person during vaginal, oral, or anal sex. Sometimes, people do not know they have herpes because they do not have any symptoms. Herpes cannot be cured. The disease usually causes most problems during the first few years. After that, the virus is still there, but it causes few to no symptoms. Even when the virus is active, people with herpes  can take medicines to reduce and help prevent symptoms.  If you have been prescribed medications to be taken on a regular basis, it is important to follow the recommendations and take them as ordered.  Some people with herpes never have any symptoms. But other people can develop symptoms within a few weeks of being infected with the herpes virus   Symptoms usually include blisters in the genital area. In women, this area includes the vagina, buttocks, anus, or thighs. In men, this area includes the penis, scrotum, anus, butt, or thighs. The blisters can become painful open sores, which then crust over as they heal. Sometimes, people can have other symptoms that include:  ?Blisters on the mouth or lips ?Fever, headache, or pain in the joints ?Trouble urinating  Outbreaks might occur every month or more often, or just once or twice a year. Sometimes, people can tell when an outbreak will occur, because they feel itching or pain beforehand. Sometimes they do not know that an outbreak is coming because they have no symptoms. Whatever your pattern is, keep in mind that herpes outbreaks usually become less frequent over time as you get older. Certain things, called "triggers," can make outbreaks more likely to occur. These include stress, sunlight, menstrual periods,or getting sick.  Antiviral therapy can shorten the duration of symptoms and signs in primary infection, which, when untreated, can be associated with significant increase in the   symptoms of the disease.  HOME CARE Use a portable bath (such as a "Sitz bath") where you can sit in warm water for about 20 minutes. Your bathtub could also work. Avoid bubble baths.  Keep the genital area clean and dry and avoid tight clothes.  Take over-the-counter pain medicine such as acetaminophen (brand name: Tylenol) or ibuprofen sample brand names: Advil, Motrin). But avoid aspirin.  Only take medications as instructed by your medical team.  You are  most likely to spread herpes to a sex partner when you have blisters and open sores on your body. But it's also possible to spread herpes to your partner when you do not have any symptoms. That is because herpes can be present on your body without causing any symptoms, like blisters or pain.  Telling your sex partner that you have herpes can be hard. But it can help protect them, since there are ways to lower the risk of spreading the infection.   Using a condom every time you have sex  Not having sex when you have symptoms  Not having oral sex if you have blisters or open sores (in the genital area or around your mouth)  MAKE SURE YOU   Understand these instructions. Do not have sex without using a condom until you have been seen by a doctor and as instructed by the provider If you are not better or improved within 7 days, you MUST have a follow up at your doctor or the health department for evaluation. There are other causes of rashes in the genital region.  Thank you for choosing an e-visit.  Your e-visit answers were reviewed by a board certified advanced clinical practitioner to complete your personal care plan. Depending upon the condition, your plan could have included both over the counter or prescription medications.  Please review your pharmacy choice. Make sure the pharmacy is open so you can pick up prescription now. If there is a problem, you may contact your provider through MyChart messaging and have the prescription routed to another pharmacy.  Your safety is important to us. If you have drug allergies check your prescription carefully.   For the next 24 hours you can use MyChart to ask questions about today's visit, request a non-urgent call back, or ask for a work or school excuse. You will get an email in the next two days asking about your experience. I hope that your e-visit has been valuable and will speed your recovery.      

## 2023-08-24 ENCOUNTER — Encounter: Payer: Self-pay | Admitting: Nurse Practitioner

## 2023-08-24 ENCOUNTER — Telehealth: Payer: Self-pay | Admitting: Nurse Practitioner

## 2023-08-24 ENCOUNTER — Telehealth: Payer: Self-pay | Admitting: Physician Assistant

## 2023-08-24 DIAGNOSIS — A6004 Herpesviral vulvovaginitis: Secondary | ICD-10-CM

## 2023-08-24 MED ORDER — VALACYCLOVIR HCL 500 MG PO TABS: 500.0000 mg | ORAL_TABLET | Freq: Two times a day (BID) | ORAL | 1 refills | Status: AC

## 2023-08-24 NOTE — Progress Notes (Signed)
Because of persistent and frequent outbreaks, I feel your condition warrants further evaluation and I recommend that you be seen in a face to face visit.   NOTE: There will be NO CHARGE for this eVisit   If you are having a true medical emergency please call 911.      For an urgent face to face visit, [facility] has eight urgent care centers for your convenience:   NEW!! [facility] at [facility] Get Driving Directions [phone] [address] [place], [address]    [facility] Center at [facility] Get Driving Directions [phone] [address] [place], [place] [address]   [facility]) Get Driving Directions [phone] [address] [place], [place] [address]  [facility] Center [facility]) Get Driving Directions [phone] [address] [place],  [place]  [address]  [facility] Center [facility] - at [employer]  [phone] [address] [place],  [place] [address]   [facility] Urgent Care at [facility] Get Driving Directions [phone] [address] [place], [place] [address]   [facility] at [facility] Get Driving Directions  [phone] [address] [place], [place] [address]   [facility] Care at [facility] Directions [phone] [address] [place], [place] [address]  Your MyChart E-visit questionnaire answers were reviewed by a board certified advanced clinical practitioner to complete your personal care plan based on your specific symptoms.  Thank you for using e-Visits.

## 2023-08-24 NOTE — Progress Notes (Signed)
Virtual Visit Consent   Ana Miller, you are scheduled for a virtual visit with a [facility] provider today. Just as with appointments in the office, your consent must be obtained to participate. Your consent will be active for this visit and any virtual visit you may have with one of our providers in the next 365 days. If you have a MyChart account, a copy of this consent can be sent to you electronically.  As this is a virtual visit, video technology does not allow for your provider to perform a traditional examination. This may limit your provider's ability to fully assess your condition. If your provider identifies any concerns that need to be evaluated in person or the need to arrange testing (such as labs, EKG, etc.), we will make arrangements to do so. Although advances in technology are sophisticated, we cannot ensure that it will always work on either your end or our end. If the connection with a video visit is poor, the visit may have to be switched to a telephone visit. With either a video or telephone visit, we are not always able to ensure that we have a secure connection.  By engaging in this virtual visit, you consent to the provision of healthcare and authorize for your insurance to be billed (if applicable) for the services provided during this visit. Depending on your insurance coverage, you may receive a charge related to this service.  I need to obtain your verbal consent now. Are you willing to proceed with your visit today? Ana Miller has provided verbal consent on 08/24/2023 for a virtual visit (video or telephone). Viviano Simas, FNP  Date: 08/24/2023 4:27 PM  Virtual Visit via Video Note   I, Viviano Simas, connected with  Ana Miller  ([id number], 02-03-1991) on 08/24/23 at  4:30 PM EDT by a video-enabled telemedicine application and verified that I am speaking with the correct person using two identifiers.  Location: Patient: Virtual Visit Location Patient:  Home Provider: Virtual Visit Location Provider: Home Office   I discussed the limitations of evaluation and management by telemedicine and the availability of in person appointments. The patient expressed understanding and agreed to proceed.    History of Present Illness: Ana Miller is a 32 y.o. who identifies as a female who was assigned female at birth, and is being seen today for recurrent genital herpes outbreaks  She was first diagnosed with genital herpes in 2019 Was initially having infrequent outbreaks  In the past 4 months she has had an outbreak each month seems to align with her menstraul cycle   She has in the past been given a medication to take daily for suppression - in the past few months she has just been treating herself episodically   She is awaiting new insurance in order to see her doctor She should be getting her insurance in 2-3 weeks   Problems: There are no problems to display for this patient.   Allergies:  Allergies  Allergen Reactions   Amoxicillin    Medications:  Current Outpatient Medications:    valACYclovir (VALTREX) 500 MG tablet, Take 1 tablet (500 mg total) by mouth 2 (two) times daily., Disp: 6 tablet, Rfl: 0  Observations/Objective: Patient is well-developed, well-nourished in no acute distress.  Resting comfortably  at home.  Head is normocephalic, atraumatic.  No labored breathing.  Speech is clear and coherent with logical content.  Patient is alert and oriented at baseline.    Assessment and Plan:  1. Herpes simplex vulvovaginitis  Advised follow up with PCP when able to discuss getting back on suppressive therapy due to frequent   - valACYclovir (VALTREX) 500 MG tablet; Take 1 tablet (500 mg total) by mouth 2 (two) times daily for 3 days.  Dispense: 6 tablet; Refill: 1       Follow Up Instructions: I discussed the assessment and treatment plan with the patient. The patient was provided an opportunity to ask questions  and all were answered. The patient agreed with the plan and demonstrated an understanding of the instructions.  A copy of instructions were sent to the patient via MyChart unless otherwise noted below.    The patient was advised to call back or seek an in-person evaluation if the symptoms worsen or if the condition fails to improve as anticipated.    Viviano Simas, FNP

## 2024-10-07 ENCOUNTER — Emergency Department

## 2024-10-07 ENCOUNTER — Other Ambulatory Visit: Payer: Self-pay

## 2024-10-07 ENCOUNTER — Emergency Department: Admission: EM | Admit: 2024-10-07 | Discharge: 2024-10-07 | Disposition: A | Discharge status: Home / Self Care

## 2024-10-07 DIAGNOSIS — J101 Influenza due to other identified influenza virus with other respiratory manifestations: Secondary | ICD-10-CM | POA: Insufficient documentation

## 2024-10-07 LAB — RESP PANEL BY RT-PCR (RSV, FLU A&B, COVID)  RVPGX2
Influenza A by PCR: POSITIVE — AB
Influenza B by PCR: NEGATIVE
Resp Syncytial Virus by PCR: NEGATIVE
SARS Coronavirus 2 by RT PCR: NEGATIVE

## 2024-10-07 LAB — GROUP A STREP BY PCR: Group A Strep by PCR: NOT DETECTED

## 2024-10-07 MED ORDER — ACETAMINOPHEN 325 MG PO TABS
650.0000 mg | ORAL_TABLET | Freq: Once | ORAL | Status: AC | PRN
  Administered 2024-10-07: 650 mg via ORAL
  Filled 2024-10-07: qty 2

## 2024-10-07 NOTE — ED Triage Notes (Signed)
 Pt presents with fever, HA, body aches, productive cough, sore throat. Pt reports a temp of 102.7. Pt last took ibuprofen at 10:00 this AM without relief. Denies sick contacts.

## 2024-10-07 NOTE — ED Provider Notes (Signed)
 [facility] Provider Note    Event Date/Time   First MD Initiated Contact with Patient 10/07/24 2149     (approximate)   History   Fever   HPI  Ana Miller is a 33 y.o. female with no PMH presents for evaluation of fever, headache, body aches, cough, sore throat.  Patient states her symptoms began this morning.  She denies chest pain and difficulty breathing. She took ibuprofen this morning without relief.      Physical Exam   Triage Vital Signs: ED Triage Vitals  Encounter Vitals Group     BP --      Girls Systolic BP Percentile --      Girls Diastolic BP Percentile --      Boys Systolic BP Percentile --      Boys Diastolic BP Percentile --      Pulse Rate 10/07/24 1956 (!) 114     Resp 10/07/24 1956 (!) 22     Temp 10/07/24 1956 99 F (37.2 C)     Temp Source 10/07/24 1956 Oral     SpO2 10/07/24 1956 100 %     Weight 10/07/24 1958 120 lb (54.4 kg)     Height 10/07/24 1958 5' 5 (1.651 m)     Head Circumference --      Peak Flow --      Pain Score 10/07/24 1957 10     Pain Loc --      Pain Education --      Exclude from Growth Chart --     Most recent vital signs: Vitals:   10/07/24 1956 10/07/24 2300  Pulse: (!) 114 92  Resp: (!) 22 18  Temp: 99 F (37.2 C) 99 F (37.2 C)  SpO2: 100% 97%   General: Awake, no distress.  CV:  Good peripheral perfusion. RRR. Resp:  Normal effort. CTAB. Abd:  No distention.  Other:  Oral mucous membranes are moist, no pharyngeal erythema, no tonsillar enlargement or exudates.  No lymphadenopathy.   ED Results / Procedures / Treatments   Labs (all labs ordered are listed, but only abnormal results are displayed) Labs Reviewed  RESP PANEL BY RT-PCR (RSV, FLU A&B, COVID)  RVPGX2 - Abnormal; Notable for the following components:      Result Value   Influenza A by PCR POSITIVE (*)    All other components within normal limits  GROUP A STREP BY PCR    RADIOLOGY  Chest x-ray obtained, I  interpreted the images as well as reviewed the radiologist report, which was negative for any acute cardiopulmonary abnormalities.   PROCEDURES:  Critical Care performed: No  Procedures   MEDICATIONS ORDERED IN ED: Medications  acetaminophen  (TYLENOL ) tablet 650 mg (650 mg Oral Given 10/07/24 2007)     IMPRESSION / MDM / ASSESSMENT AND PLAN / ED COURSE  I reviewed the triage vital signs and the nursing notes.                             33 year old female presents for evaluation of fever.  Patient was tachycardic and tachypneic upon initial presentation, vital signs stable otherwise.  Patient uncomfortable appearing on exam.  Differential diagnosis includes, but is not limited to, flu, COVID, RSV, strep pharyngitis, pneumonia, bronchitis.  Patient's presentation is most consistent with acute complicated illness / injury requiring diagnostic workup.  Patient's tachycardia and tachypnea improved after receiving the Tylenol .  Respiratory panel  was positive for influenza A.  Strep test was negative.  Chest x-ray was also negative. Physical exam is reassuring. Patient was given Tylenol  for pain while in the emergency department.  Recommended symptomatic management.  Patient was given a note for work.  She voiced understanding, all questions were answered and she was stable at discharge.      FINAL CLINICAL IMPRESSION(S) / ED DIAGNOSES   Final diagnoses:  Influenza A     Rx / DC Orders   ED Discharge Orders     None        Note:  This document was prepared using Dragon voice recognition software and may include unintentional dictation errors.   Cleaster Tinnie LABOR, PA-C 10/07/24 2301    Rexford Reche HERO, MD 10/08/24 [phone]

## 2024-10-07 NOTE — ED Notes (Signed)
 Patient transported to X-ray

## 2024-10-07 NOTE — Discharge Instructions (Signed)
 You tested positive for the flu today.  This is a viral illness which will resolve on its own with time.  You do not need an antibiotic.  You can take over-the-counter cold medicine as needed to manage your symptoms.  If you are taking combination cold medicine keep in mind that this often contains Tylenol so if you need additional medication for body aches or fever control please take Motrin or ibuprofen.  Your symptoms should resolve with time, if you have had symptoms for greater than 10 days please be evaluated by another healthcare provider as at this point it may have developed into a bacterial infection which requires a different treatment.  Return to the emergency department with worsening symptoms.
